# Patient Record
Sex: Male | Born: 1990 | Race: White | Hispanic: No | Marital: Married | Smoking: Current every day smoker
Health system: Southern US, Community
[De-identification: ages and names within clinical notes are randomized; demographics above are authoritative.]

## PROBLEM LIST (undated history)

## (undated) DIAGNOSIS — R011 Cardiac murmur, unspecified: Secondary | ICD-10-CM

## (undated) DIAGNOSIS — Z87442 Personal history of urinary calculi: Secondary | ICD-10-CM

---

## 2015-07-01 ENCOUNTER — Emergency Department: Payer: Self-pay

## 2015-07-01 ENCOUNTER — Emergency Department
Admission: EM | Admit: 2015-07-01 | Discharge: 2015-07-01 | Disposition: A | Discharge status: Home / Self Care | Payer: Self-pay | Attending: Emergency Medicine | Admitting: Emergency Medicine

## 2015-07-01 ENCOUNTER — Encounter: Payer: Self-pay | Admitting: *Deleted

## 2015-07-01 DIAGNOSIS — Z720 Tobacco use: Secondary | ICD-10-CM | POA: Insufficient documentation

## 2015-07-01 DIAGNOSIS — W108XXA Fall (on) (from) other stairs and steps, initial encounter: Secondary | ICD-10-CM | POA: Insufficient documentation

## 2015-07-01 DIAGNOSIS — Y9289 Other specified places as the place of occurrence of the external cause: Secondary | ICD-10-CM | POA: Insufficient documentation

## 2015-07-01 DIAGNOSIS — Y9301 Activity, walking, marching and hiking: Secondary | ICD-10-CM | POA: Insufficient documentation

## 2015-07-01 DIAGNOSIS — S66901A Unspecified injury of unspecified muscle, fascia and tendon at wrist and hand level, right hand, initial encounter: Secondary | ICD-10-CM | POA: Insufficient documentation

## 2015-07-01 DIAGNOSIS — Z880 Allergy status to penicillin: Secondary | ICD-10-CM | POA: Insufficient documentation

## 2015-07-01 DIAGNOSIS — S60211A Contusion of right wrist, initial encounter: Secondary | ICD-10-CM | POA: Insufficient documentation

## 2015-07-01 DIAGNOSIS — Y998 Other external cause status: Secondary | ICD-10-CM | POA: Insufficient documentation

## 2015-07-01 HISTORY — DX: Cardiac murmur, unspecified: R01.1

## 2015-07-01 MED ORDER — OXYCODONE-ACETAMINOPHEN 5-325 MG PO TABS
1.0000 | ORAL_TABLET | Freq: Once | ORAL | Status: AC
  Administered 2015-07-01: 1 via ORAL
  Filled 2015-07-01: qty 1

## 2015-07-01 MED ORDER — OXYCODONE-ACETAMINOPHEN 5-325 MG PO TABS: 1.0000 | ORAL_TABLET | Freq: Four times a day (QID) | ORAL | Status: DC | PRN

## 2015-07-01 MED ORDER — MELOXICAM 15 MG PO TABS: 15.0000 mg | ORAL_TABLET | Freq: Every day | ORAL | Status: DC

## 2015-07-01 NOTE — ED Provider Notes (Signed)
[facility] Emergency Department Provider Note  ____________________________________________  Time seen: Approximately 8:25 PM  I have reviewed the triage vital signs and the nursing notes.   HISTORY  Chief Complaint Hand Injury    HPI Gabriel BergeronBrandon Abrams is a 24 y.o. male who presents to the emergency department complaining of right hand pain status post an injury. He states that he was walking down a flight of steps when he missed 1 step and subsequently falling and landing on the outstretched hand. He endorses pain to the fourth and fifth digits. He endorses swelling. He endorses a minor abrasion to the PIP joint fourth finger.He states that he has been unable to move the fourth and fifth digits since injury. He denies Striking his head or losing consciousness. He denies any other injury. Pain is sharp, severe, worse with any movement.   Past Medical History  Diagnosis Date  . Heart murmur     There are no active problems to display for this patient.   History reviewed. No pertinent past surgical history.  Current Outpatient Rx  Name  Route  Sig  Dispense  Refill  . meloxicam (MOBIC) 15 MG tablet   Oral   Take 1 tablet (15 mg total) by mouth daily.   30 tablet   0   . oxyCODONE-acetaminophen (ROXICET) 5-325 MG tablet   Oral   Take 1 tablet by mouth every 6 (six) hours as needed for severe pain.   20 tablet   0     Allergies Penicillins  History reviewed. No pertinent family history.  Social History Social History  Substance Use Topics  . Smoking status: Current Every Day Smoker -- 0.50 packs/day    Types: Cigarettes  . Smokeless tobacco: None  . Alcohol Use: No    Review of Systems Constitutional: No fever/chills Eyes: No visual changes. ENT: No sore throat. Cardiovascular: Denies chest pain. Respiratory: Denies shortness of breath. Gastrointestinal: No abdominal pain.  No nausea, no vomiting.  No diarrhea.  No  constipation. Genitourinary: Negative for dysuria. Musculoskeletal: Negative for back pain. Endorses right hand pain. Skin: Negative for rash. Neurological: Negative for headaches, focal weakness or numbness.  10-point ROS otherwise negative.  ____________________________________________   PHYSICAL EXAM:  VITAL SIGNS: ED Triage Vitals  Enc Vitals Group     BP 07/01/15 1943 126/71 mmHg     Pulse Rate 07/01/15 1943 75     Resp 07/01/15 1943 18     Temp 07/01/15 1943 98.3 F (36.8 C)     Temp Source 07/01/15 1943 Oral     SpO2 07/01/15 1943 99 %     Weight 07/01/15 1943 150 lb (68.04 kg)     Height 07/01/15 1943 5\' 11"  (1.803 m)     Head Cir --      Peak Flow --      Pain Score 07/01/15 1948 10     Pain Loc --      Pain Edu? --      Excl. in GC? --     Constitutional: Alert and oriented. Well appearing and in no acute distress. Eyes: Conjunctivae are normal. PERRL. EOMI. Head: Atraumatic. Nose: No congestion/rhinnorhea. Mouth/Throat: Mucous membranes are moist.  Oropharynx non-erythematous. Neck: No stridor.  No cervical spine tenderness to palpation. Cardiovascular: Normal rate, regular rhythm. Grossly normal heart sounds.  Good peripheral circulation. Respiratory: Normal respiratory effort.  No retractions. Lungs CTAB. Gastrointestinal: Soft and nontender. No distention. No abdominal bruits. No CVA tenderness. Musculoskeletal: No lower  extremity tenderness nor edema.  No joint effusions. Edema noted to right medial hand when compared with last. No visible deformity. Minor abrasion noted to PIP joint fourth digit. No ecchymosis or contusion noted. Patient is exquisitely tender to fourth and fifth metatarsals of fourth and fifth phalanges. No specific point tenderness. No deformity palpated. Minimal range of motion to fourth and fifth digits. Sensation Refill present distally. Neurologic:  Normal speech and language. No gross focal neurologic deficits are appreciated. No gait  instability. Skin:  Skin is warm, dry and intact. No rash noted. Psychiatric: Mood and affect are normal. Speech and behavior are normal.  ____________________________________________   LABS (all labs ordered are listed, but only abnormal results are displayed)  Labs Reviewed - No data to display ____________________________________________  EKG   ____________________________________________  RADIOLOGY  Right hand x-ray Impression: No acute bony abnormality. No fractures or dislocations. ____________________________________________   PROCEDURES  Procedure(s) performed: Yes, splint, see procedure note(s).  SPLINT APPLICATION Date/Time: 8:46 PM Authorized by: Racheal Patches Consent: Verbal consent obtained. Risks and benefits: risks, benefits and alternatives were discussed Consent given by: patient Splint applied by: orthopedic technician Location details: Right hand  Splint type: Ulnar gutter  Supplies used: Ortho-Glass  Post-procedure: The splinted body part was neurovascularly unchanged following the procedure. Patient tolerance: Patient tolerated the procedure well with no immediate complications.     Critical Care performed: No  ____________________________________________   INITIAL IMPRESSION / ASSESSMENT AND PLAN / ED COURSE  Pertinent labs & imaging results that were available during my care of the patient were reviewed by me and considered in my medical decision making (see chart for details).  Patient's history, symptoms, physical exam are consistent with contusion, edema and possible tendon involvement to right hand. Advised patient and family of diagnosis and verbalize understanding. The patient will be placed in an ulnar gutter splint and sent to orthopedics on Monday for further evaluation and follow-up. The patient is to be given anti-inflammatories and narcotics for symptom control. ____________________________________________   FINAL  CLINICAL IMPRESSION(S) / ED DIAGNOSES  Final diagnoses:  Wrist contusion, right, initial encounter  Injury of tendon of hand, right, initial encounter      Racheal Patches, PA-C 07/01/15 2140  Sharyn Creamer, MD 07/02/15 [phone]

## 2015-07-01 NOTE — Discharge Instructions (Signed)
Periosteal Hematoma °Periosteal hematoma (bone bruise) is a localized, tender, raised area close to the bone. It can occur from a small hidden fracture of the bone, following surgery, or from other trauma to the area. It typically occurs in bones located close to the surface of the skin, such as the shin, knee, and heel bone. Although it may take 2 or more weeks to completely heal, bone bruises typically are not associated with permanent or serious damage to the bone. If you are taking blood thinners, you may be at greater risk for such injuries.  °CAUSES  °A bone bruise is usually caused by high-impact trauma to the bone, but it can be caused by sports injuries or twisting injuries. °SIGNS AND SYMPTOMS  °· Severe pain around the injured area that typically lasts longer than a normal bruise. °· Difficulty using the bruised area. °· Tender, raised area close to the bone. °· Discoloration or swelling of the bruised area. °DIAGNOSIS  °You may need an MRI of the injured area to confirm a bone bruise if your health care provider feels it is necessary. A regular X-ray will not detect a bone bruise, but it will detect a broken bone (fracture). An X-ray may be taken to rule out any fractures. °TREATMENT  °Often, the best treatment for a bone bruise is resting, icing, and applying cold compresses to the injured area. Over-the-counter medicines may also be recommended for pain control. °HOME CARE INSTRUCTIONS  °Some things you can do to improve the condition are:  °· Rest and elevate the area of injury as long as it is very tender or swollen. °· Apply ice to the injured area: °· Put ice in a plastic bag. °· Place a towel between your skin and the bag. °· Leave the ice on for 20 minutes, 2-3 times a day. °· Use an elastic wrap to reduce swelling and protect the injured area. Make sure it is not applied too tightly. If the area around the wrap becomes cold or blue, the wrap is too tight. Wrap it more loosely. °· For  activity: °· Follow your health care provider's instructions about whether walking with crutches is required. This will depend on how serious your condition is. °· Start weight bearing gradually on the bruised part. °· Continue to use crutches or a cane until you can stand without causing pain, or as instructed. °· If a plaster splint was applied: °· Wear the splint until you are seen for a follow-up exam. °· Rest it on nothing harder than a pillow the first 24 hours. °· Do not put weight on it. °· Do not get it wet. You may take it off to take a shower or bath. °· You may have been given an elastic bandage to use with or without the plaster splint. The splint is too tight if you have numbness or tingling, or if the skin around the bandage becomes cold and blue. Adjust the bandage to make it comfortable. °· If an air splint was applied: °· You may alter the amount of air in the splint as needed for comfort. °· You may take it off at night and to take a shower or bath. °· If the injury was in either leg, wiggle your toes in the splint several times per day if you are able. °· Only take over-the-counter or prescription medicines for pain, discomfort, or fever as directed by your health care provider. °· Keep all follow-up visits with your health care provider. This includes   any orthopedic referrals, physical therapy, and rehabilitation. Any delay in getting necessary care could result in a delay or failure of the bones to heal. SEEK MEDICAL CARE IF:   You have an increase in bruising, swelling, tenderness, heat, or pain over your injury.  You notice coldness of your toes that does not improve after removing a splint or bandage.  Your pain is not lessened after you take medicine.  You have increased difficulty bearing weight on the injured leg, if the injury is in either leg. SEEK IMMEDIATE MEDICAL CARE IF:   You have severe pain near the injured area or severe pain with stretching.  You have increased  swelling that resulted in a tense, hard area or a loss of sensation in the area of the injury.  You have pale, cool skin below the area of the injury (in an extremity) that does not go away after removing a splint or bandage. MAKE SURE YOU:   Understand these instructions.  Will watch your condition.  Will get help right away if you are not doing well or get worse.   This information is not intended to replace advice given to you by your health care provider. Make sure you discuss any questions you have with your health care provider.   Document Released: 09/20/2004 Document Revised: 06/03/2013 Document Reviewed: 01/30/2013 Elsevier Interactive Patient Education 2016 [employer] Inc[employer]  Tendon Injury Tendons are strong, cordlike structures that connect muscle to bone. Tendons are made up of woven fibers, like a rope. A tendon injury is a tear (rupture) of the tendon. The rupture may be partial (only a few of the fibers in your tendon rupture) or complete (your entire tendon ruptures). CAUSES  Tendon injuries can be caused by high-stress activities, such as sports. They also can be caused by a repetitive injury or by a single injury from an excessive, rapid force. SYMPTOMS  Symptoms of tendon injury include pain when you move the joint close to the tendon. Other symptoms are swelling, redness, and warmth. DIAGNOSIS  Tendon injuries often can be diagnosed by physical exam. However, sometimes an X-ray exam or advanced imaging, such as magnetic resonance imaging (MRI), is necessary to determine the extent of the injury. TREATMENT  Partial tendon ruptures often can be treated with immobilization. A splint, bandage, or removable brace usually is used to immobilize the injured tendon. Most injured tendons need to be immobilized for 1-2 months before they are completely healed. Complete tendon ruptures may require surgical reattachment.   This information is not intended to replace advice given to you  by your health care provider. Make sure you discuss any questions you have with your health care provider.   Document Released: 09/20/2004 Document Revised: 08/02/2011 Document Reviewed: 11/04/2011 Elsevier Interactive Patient Education 2016 Elsevier Inc.  Cryotherapy Cryotherapy means treatment with cold. Ice or gel packs can be used to reduce both pain and swelling. Ice is the most helpful within the first 24 to 48 hours after an injury or flare-up from overusing a muscle or joint. Sprains, strains, spasms, burning pain, shooting pain, and aches can all be eased with ice. Ice can also be used when recovering from surgery. Ice is effective, has very few side effects, and is safe for most people to use. PRECAUTIONS  Ice is not a safe treatment option for people with:  Raynaud phenomenon. This is a condition affecting small blood vessels in the extremities. Exposure to cold may cause your problems to return.  Cold hypersensitivity. There  are many forms of cold hypersensitivity, including:  Cold urticaria. Red, itchy hives appear on the skin when the tissues begin to warm after being iced.  Cold erythema. This is a red, itchy rash caused by exposure to cold.  Cold hemoglobinuria. Red blood cells break down when the tissues begin to warm after being iced. The hemoglobin that carry oxygen are passed into the urine because they cannot combine with blood proteins fast enough.  Numbness or altered sensitivity in the area being iced. If you have any of the following conditions, do not use ice until you have discussed cryotherapy with your caregiver:  Heart conditions, such as arrhythmia, angina, or chronic heart disease.  High blood pressure.  Healing wounds or open skin in the area being iced.  Current infections.  Rheumatoid arthritis.  Poor circulation.  Diabetes. Ice slows the blood flow in the region it is applied. This is beneficial when trying to stop inflamed tissues from  spreading irritating chemicals to surrounding tissues. However, if you expose your skin to cold temperatures for too long or without the proper protection, you can damage your skin or nerves. Watch for signs of skin damage due to cold. HOME CARE INSTRUCTIONS Follow these tips to use ice and cold packs safely.  Place a dry or damp towel between the ice and skin. A damp towel will cool the skin more quickly, so you may need to shorten the time that the ice is used.  For a more rapid response, add gentle compression to the ice.  Ice for no more than 10 to 20 minutes at a time. The bonier the area you are icing, the less time it will take to get the benefits of ice.  Check your skin after 5 minutes to make sure there are no signs of a poor response to cold or skin damage.  Rest 20 minutes or more between uses.  Once your skin is numb, you can end your treatment. You can test numbness by very lightly touching your skin. The touch should be so light that you do not see the skin dimple from the pressure of your fingertip. When using ice, most people will feel these normal sensations in this order: cold, burning, aching, and numbness.  Do not use ice on someone who cannot communicate their responses to pain, such as small children or people with dementia. HOW TO MAKE AN ICE PACK Ice packs are the most common way to use ice therapy. Other methods include ice massage, ice baths, and cryosprays. Muscle creams that cause a cold, tingly feeling do not offer the same benefits that ice offers and should not be used as a substitute unless recommended by your caregiver. To make an ice pack, do one of the following:  Place crushed ice or a bag of frozen vegetables in a sealable plastic bag. Squeeze out the excess air. Place this bag inside another plastic bag. Slide the bag into a pillowcase or place a damp towel between your skin and the bag.  Mix 3 parts water with 1 part rubbing alcohol. Freeze the mixture  in a sealable plastic bag. When you remove the mixture from the freezer, it will be slushy. Squeeze out the excess air. Place this bag inside another plastic bag. Slide the bag into a pillowcase or place a damp towel between your skin and the bag. SEEK MEDICAL CARE IF:  You develop white spots on your skin. This may give the skin a blotchy (mottled) appearance.  Your  skin turns blue or pale.  Your skin becomes waxy or hard.  Your swelling gets worse. MAKE SURE YOU:   Understand these instructions.  Will watch your condition.  Will get help right away if you are not doing well or get worse.   This information is not intended to replace advice given to you by your health care provider. Make sure you discuss any questions you have with your health care provider.   Document Released: 04/09/2011 Document Revised: 09/03/2014 Document Reviewed: 04/09/2011 Elsevier Interactive Patient Education [employer]2016 [employer] Inc.

## 2015-07-01 NOTE — ED Notes (Signed)
Right hand injury since falling down the stairs.  Denies other injuries.  Abrasion, deformity noted to right 4-5th fingers.

## 2015-07-31 ENCOUNTER — Emergency Department
Admission: EM | Admit: 2015-07-31 | Discharge: 2015-07-31 | Disposition: A | Discharge status: Home / Self Care | Payer: Self-pay | Attending: Emergency Medicine | Admitting: Emergency Medicine

## 2015-07-31 ENCOUNTER — Emergency Department: Payer: Self-pay

## 2015-07-31 ENCOUNTER — Encounter: Payer: Self-pay | Admitting: Emergency Medicine

## 2015-07-31 DIAGNOSIS — Y9389 Activity, other specified: Secondary | ICD-10-CM | POA: Insufficient documentation

## 2015-07-31 DIAGNOSIS — S62316A Displaced fracture of base of fifth metacarpal bone, right hand, initial encounter for closed fracture: Secondary | ICD-10-CM | POA: Insufficient documentation

## 2015-07-31 DIAGNOSIS — Y990 Civilian activity done for income or pay: Secondary | ICD-10-CM | POA: Insufficient documentation

## 2015-07-31 DIAGNOSIS — Z880 Allergy status to penicillin: Secondary | ICD-10-CM | POA: Insufficient documentation

## 2015-07-31 DIAGNOSIS — S62306A Unspecified fracture of fifth metacarpal bone, right hand, initial encounter for closed fracture: Secondary | ICD-10-CM

## 2015-07-31 DIAGNOSIS — Y9289 Other specified places as the place of occurrence of the external cause: Secondary | ICD-10-CM | POA: Insufficient documentation

## 2015-07-31 DIAGNOSIS — W1789XA Other fall from one level to another, initial encounter: Secondary | ICD-10-CM | POA: Insufficient documentation

## 2015-07-31 DIAGNOSIS — F17210 Nicotine dependence, cigarettes, uncomplicated: Secondary | ICD-10-CM | POA: Insufficient documentation

## 2015-07-31 DIAGNOSIS — Z791 Long term (current) use of non-steroidal anti-inflammatories (NSAID): Secondary | ICD-10-CM | POA: Insufficient documentation

## 2015-07-31 MED ORDER — OXYCODONE-ACETAMINOPHEN 5-325 MG PO TABS
2.0000 | ORAL_TABLET | Freq: Once | ORAL | Status: AC
  Administered 2015-07-31: 2 via ORAL
  Filled 2015-07-31: qty 2

## 2015-07-31 MED ORDER — LIDOCAINE HCL (PF) 1 % IJ SOLN
INTRAMUSCULAR | Status: AC
  Filled 2015-07-31: qty 5

## 2015-07-31 MED ORDER — OXYCODONE-ACETAMINOPHEN 5-325 MG PO TABS: 1.0000 | ORAL_TABLET | Freq: Four times a day (QID) | ORAL | Status: DC | PRN

## 2015-07-31 MED ORDER — IBUPROFEN 800 MG PO TABS
800.0000 mg | ORAL_TABLET | Freq: Three times a day (TID) | ORAL | Status: DC | PRN
Start: 2015-07-31 — End: 2015-09-07

## 2015-07-31 NOTE — Discharge Instructions (Signed)
Wear splint until evaluation by Ortho clinic.

## 2015-07-31 NOTE — ED Provider Notes (Signed)
[facility] Emergency Department Provider Note  ____________________________________________  Time seen: Approximately 10:17 PM  I have reviewed the triage vital signs and the nursing notes.   HISTORY  Chief Complaint Hand Injury    HPI Gabriel Mckee is a 24 y.o. male chief complaint right hand pain secondary to blunt trauma. Patient state he was working on a transmission when it fell approximately 2 feet landing on his right hand. Patient stated was bleeding between the webspace which is resolved. Patient stated decreased range of motion with movement and fifth digit. Patient rates pain as a 10 over 10. Patient describes pain as sharp. No palliative measures taken for this complaint. Patient is right-hand dominant.   Past Medical History  Diagnosis Date  . Heart murmur     There are no active problems to display for this patient.   History reviewed. No pertinent past surgical history.  Current Outpatient Rx  Name  Route  Sig  Dispense  Refill  . ibuprofen (ADVIL,MOTRIN) 800 MG tablet   Oral   Take 1 tablet (800 mg total) by mouth every 8 (eight) hours as needed for moderate pain.   15 tablet   0   . meloxicam (MOBIC) 15 MG tablet   Oral   Take 1 tablet (15 mg total) by mouth daily.   30 tablet   0   . oxyCODONE-acetaminophen (ROXICET) 5-325 MG tablet   Oral   Take 1 tablet by mouth every 6 (six) hours as needed for severe pain.   20 tablet   0   . oxyCODONE-acetaminophen (ROXICET) 5-325 MG tablet   Oral   Take 1 tablet by mouth every 6 (six) hours as needed for severe pain.   12 tablet   0     Allergies Penicillins  History reviewed. No pertinent family history.  Social History Social History  Substance Use Topics  . Smoking status: Current Every Day Smoker -- 0.50 packs/day    Types: Cigarettes  . Smokeless tobacco: None  . Alcohol Use: No    Review of Systems Constitutional: No fever/chills Eyes: No visual  changes. ENT: No sore throat. Cardiovascular: Denies chest pain. Respiratory: Denies shortness of breath. Gastrointestinal: No abdominal pain.  No nausea, no vomiting.  No diarrhea.  No constipation. Genitourinary: Negative for dysuria. Musculoskeletal: Negative for back pain. Skin: Negative for rash. Neurological: Negative for headaches, focal weakness or numbness. Allergic/Immunilogical: Penicillin  10-point ROS otherwise negative.  ____________________________________________   PHYSICAL EXAM:  VITAL SIGNS: ED Triage Vitals  Enc Vitals Group     BP 07/31/15 2206 116/73 mmHg     Pulse Rate 07/31/15 2206 98     Resp 07/31/15 2206 20     Temp 07/31/15 2206 98.1 F (36.7 C)     Temp Source 07/31/15 2206 Oral     SpO2 07/31/15 2206 97 %     Weight 07/31/15 2206 161 lb (73.029 kg)     Height 07/31/15 2206  (1.803 m)     Head Cir --      Peak Flow --      Pain Score 07/31/15 2212 10     Pain Loc --      Pain Edu? --      Excl. in GC? --     Constitutional: Alert and oriented. Well appearing and in no acute distress. Eyes: Conjunctivae are normal. PERRL. EOMI. Head: Atraumatic. Nose: No congestion/rhinnorhea. Mouth/Throat: Mucous membranes are moist.  Oropharynx non-erythematous. Neck: No stridor.  No cervical spine tenderness to palpation. Hematological/Lymphatic/Immunilogical: No cervical lymphadenopathy. Cardiovascular: Normal rate, regular rhythm. Grossly normal heart sounds.  Good peripheral circulation. Respiratory: Normal respiratory effort.  No retractions. Lungs CTAB. Gastrointestinal: Soft and nontender. No distention. No abdominal bruits. No CVA tenderness. Musculoskeletal: No lower extremity tenderness nor edema.  No joint effusions. Neurologic:  Normal speech and language. No gross focal neurologic deficits are appreciated. No gait instability. Skin:  Skin is warm, dry and intact. No rash noted. Psychiatric: Mood and affect are normal. Speech and  behavior are normal.  ____________________________________________   LABS (all labs ordered are listed, but only abnormal results are displayed)  Labs Reviewed - No data to display ____________________________________________  EKG   ____________________________________________  RADIOLOGY  Mildly displaced slightly comminuted fracture to the base of the fifth metacarpal right hand. I, Joni Reiningonald K Sinclaire Artiga, personally viewed and evaluated these images (plain radiographs) as part of my medical decision making.   ____________________________________________   PROCEDURES  Procedure(s) performed: None  Critical Care performed: No  ____________________________________________   INITIAL IMPRESSION / ASSESSMENT AND PLAN / ED COURSE  Pertinent labs & imaging results that were available during my care of the patient were reviewed by me and considered in my medical decision making (see chart for details). Right fifth metacarpal fracture. Patient placed in ulnar gutter splint. Patient given a prescription for Percocets and ibuprofen. Patient advised to call orthopedic clinic in the morning for follow-up evaluation and treatment. ____________________________________________   FINAL CLINICAL IMPRESSION(S) / ED DIAGNOSES  Final diagnoses:  Fracture of fifth metacarpal bone of right hand, closed, initial encounter      Joni ReiningRonald K Nikolaos Maddocks, PA-C 07/31/15 2313  Jeanmarie PlantJames A McShane, MD 07/31/15 2355

## 2015-07-31 NOTE — ED Notes (Signed)
Was working on a transmission and it fell onto his hand from a 2 foot height. Small amt of blood between the 4th and 5th digit. No other laceration.

## 2015-09-07 ENCOUNTER — Emergency Department
Admission: EM | Admit: 2015-09-07 | Discharge: 2015-09-07 | Disposition: A | Discharge status: Home / Self Care | Payer: Self-pay | Attending: Emergency Medicine | Admitting: Emergency Medicine

## 2015-09-07 DIAGNOSIS — Z880 Allergy status to penicillin: Secondary | ICD-10-CM | POA: Insufficient documentation

## 2015-09-07 DIAGNOSIS — X58XXXD Exposure to other specified factors, subsequent encounter: Secondary | ICD-10-CM | POA: Insufficient documentation

## 2015-09-07 DIAGNOSIS — F17210 Nicotine dependence, cigarettes, uncomplicated: Secondary | ICD-10-CM | POA: Insufficient documentation

## 2015-09-07 DIAGNOSIS — Z791 Long term (current) use of non-steroidal anti-inflammatories (NSAID): Secondary | ICD-10-CM | POA: Insufficient documentation

## 2015-09-07 DIAGNOSIS — S62306D Unspecified fracture of fifth metacarpal bone, right hand, subsequent encounter for fracture with routine healing: Secondary | ICD-10-CM | POA: Insufficient documentation

## 2015-09-07 MED ORDER — IBUPROFEN 800 MG PO TABS: 800.0000 mg | ORAL_TABLET | Freq: Three times a day (TID) | ORAL | Status: DC

## 2015-09-07 NOTE — ED Provider Notes (Signed)
[facility] Emergency Department Provider Note  ____________________________________________  Time seen: Approximately 11:32 AM  I have reviewed the triage vital signs and the nursing notes.   HISTORY  Chief Complaint Hand Pain   HPI Gabriel Mckee is a 25 y.o. male here complaint of right hand pain. He states that he was seen by the orthopedic Department and advised to come back to the emergency room if he continued to have any pain. He denies any re-injury of his right hand.He was seen in the emergency room on 12/4 for a fracture of the proximal aspect of his fifth metacarpal. He states that he did [facility] with Marcelline Matesodd Munday PA-C but has not seen him since December/23. Currently he rates his pain as a 7 out of 10.   Past Medical History  Diagnosis Date  . Heart murmur     There are no active problems to display for this patient.   No past surgical history on file.  Current Outpatient Rx  Name  Route  Sig  Dispense  Refill  . ibuprofen (ADVIL,MOTRIN) 800 MG tablet   Oral   Take 1 tablet (800 mg total) by mouth 3 (three) times daily.   30 tablet   0   . meloxicam (MOBIC) 15 MG tablet   Oral   Take 1 tablet (15 mg total) by mouth daily.   30 tablet   0     Allergies Penicillins  No family history on file.  Social History Social History  Substance Use Topics  . Smoking status: Current Every Day Smoker -- 0.50 packs/day    Types: Cigarettes  . Smokeless tobacco: Not on file  . Alcohol Use: No    Review of Systems Constitutional: No fever/chills Cardiovascular: Denies chest pain. Respiratory: Denies shortness of breath. Gastrointestinal:  No nausea, no vomiting.   Musculoskeletal: Negative for back pain. Positive right hand pain Skin: Negative for rash. Neurological: Negative for headaches, focal weakness or numbness.  10-point ROS otherwise negative.  ____________________________________________   PHYSICAL  EXAM:  VITAL SIGNS: ED Triage Vitals  Enc Vitals Group     BP 09/07/15 1117 123/80 mmHg     Pulse Rate 09/07/15 1117 83     Resp 09/07/15 1117 18     Temp 09/07/15 1117 98.9 F (37.2 C)     Temp Source 09/07/15 1117 Oral     SpO2 09/07/15 1117 100 %     Weight 09/07/15 1117 150 lb (68.04 kg)     Height 09/07/15 1117 5\' 11"  (1.803 m)     Head Cir --      Peak Flow --      Pain Score 09/07/15 1117 7     Pain Loc --      Pain Edu? --      Excl. in GC? --     Constitutional: Alert and oriented. Well appearing and in no acute distress. Eyes: Conjunctivae are normal. PERRL. EOMI. Head: Atraumatic. Nose: No congestion/rhinnorhea. Neck: No stridor.   Cardiovascular: Normal rate, regular rhythm. Grossly normal heart sounds.  Good peripheral circulation. Respiratory: Normal respiratory effort.  No retractions. Lungs CTAB. Gastrointestinal: Soft and nontender. No distention.  Musculoskeletal: Right hand with edema and marked tenderness noted on the fifth metacarpal to palpation. Range of motion of the fifth digit is slightly decreased secondary to pain in the metacarpal area. Motor sensory function intact. Neurologic:  Normal speech and language. No gross focal neurologic deficits are appreciated. No gait instability.  Skin:  Skin is warm, dry and intact. No rash noted. No ecchymosis, erythema or abrasions were noted. Psychiatric: Mood and affect are normal. Speech and behavior are normal.  ____________________________________________   LABS (all labs ordered are listed, but only abnormal results are displayed)  Labs Reviewed - No data to display   PROCEDURES  Procedure(s) performed: None  Critical Care performed: No  ____________________________________________   INITIAL IMPRESSION / ASSESSMENT AND PLAN / ED COURSE  Pertinent labs & imaging results that were available during my care of the patient were reviewed by me and considered in my medical decision making (see chart  for details).  Patient currently was not wearing a brace or splint to his hand. A call was made to [facility] orthopedic Department and notes from his last visit on 12/23 indicating that he was to follow-up with orthopedic Department in 3 weeks. He was to also continue wearing his brace to protect his hand. Patient states he was not aware that he was supposed to make an appointment but will call today. ____________________________________________   FINAL CLINICAL IMPRESSION(S) / ED DIAGNOSES  Final diagnoses:  Fracture of fifth metacarpal bone of right hand, with routine healing, subsequent encounter      Tommi Rumps, PA-C 09/07/15 1459  Minna Antis, MD 09/07/15 1501

## 2015-09-07 NOTE — ED Notes (Signed)
Pt reports broke his right wrist recently and is still having some pain and swelling. Pt reports he spoke with his Ortho MD, Lenard ForthMundy and was advised to come back to the ED if he continued to have pain. Pt denies re-injury.

## 2015-09-07 NOTE — Discharge Instructions (Signed)
Call today for an appointment with Gabriel Mckee Wear splint until you are seen. Take medications as directed.

## 2015-12-07 ENCOUNTER — Emergency Department: Payer: Self-pay

## 2015-12-07 ENCOUNTER — Emergency Department
Admission: EM | Admit: 2015-12-07 | Discharge: 2015-12-07 | Disposition: A | Discharge status: Home / Self Care | Payer: Self-pay | Attending: Emergency Medicine | Admitting: Emergency Medicine

## 2015-12-07 ENCOUNTER — Encounter: Payer: Self-pay | Admitting: Emergency Medicine

## 2015-12-07 DIAGNOSIS — F17210 Nicotine dependence, cigarettes, uncomplicated: Secondary | ICD-10-CM | POA: Insufficient documentation

## 2015-12-07 DIAGNOSIS — R1032 Left lower quadrant pain: Secondary | ICD-10-CM | POA: Insufficient documentation

## 2015-12-07 DIAGNOSIS — R109 Unspecified abdominal pain: Secondary | ICD-10-CM

## 2015-12-07 DIAGNOSIS — K529 Noninfective gastroenteritis and colitis, unspecified: Secondary | ICD-10-CM

## 2015-12-07 HISTORY — DX: Personal history of urinary calculi: Z87.442

## 2015-12-07 LAB — COMPREHENSIVE METABOLIC PANEL
ALT: 13 U/L — ABNORMAL LOW (ref 17–63)
AST: 23 U/L (ref 15–41)
Albumin: 4.7 g/dL (ref 3.5–5.0)
Alkaline Phosphatase: 62 U/L (ref 38–126)
Anion gap: 9 (ref 5–15)
BILIRUBIN TOTAL: 0.6 mg/dL (ref 0.3–1.2)
BUN: 14 mg/dL (ref 6–20)
CHLORIDE: 104 mmol/L (ref 101–111)
CO2: 25 mmol/L (ref 22–32)
CREATININE: 0.83 mg/dL (ref 0.61–1.24)
Calcium: 9.2 mg/dL (ref 8.9–10.3)
Glucose, Bld: 93 mg/dL (ref 65–99)
POTASSIUM: 3.8 mmol/L (ref 3.5–5.1)
Sodium: 138 mmol/L (ref 135–145)
TOTAL PROTEIN: 7.5 g/dL (ref 6.5–8.1)

## 2015-12-07 LAB — CBC
HEMATOCRIT: 41.6 % (ref 40.0–52.0)
Hemoglobin: 14 g/dL (ref 13.0–18.0)
MCH: 30.6 pg (ref 26.0–34.0)
MCHC: 33.7 g/dL (ref 32.0–36.0)
MCV: 90.8 fL (ref 80.0–100.0)
PLATELETS: 255 10*3/uL (ref 150–440)
RBC: 4.59 MIL/uL (ref 4.40–5.90)
RDW: 14.2 % (ref 11.5–14.5)
WBC: 9.2 10*3/uL (ref 3.8–10.6)

## 2015-12-07 LAB — URINALYSIS COMPLETE WITH MICROSCOPIC (ARMC ONLY)
BILIRUBIN URINE: NEGATIVE
GLUCOSE, UA: NEGATIVE mg/dL
HGB URINE DIPSTICK: NEGATIVE
KETONES UR: NEGATIVE mg/dL
LEUKOCYTES UA: NEGATIVE
NITRITE: NEGATIVE
PH: 7 (ref 5.0–8.0)
Protein, ur: NEGATIVE mg/dL
RBC / HPF: NONE SEEN RBC/hpf (ref 0–5)
SPECIFIC GRAVITY, URINE: 1.012 (ref 1.005–1.030)
Squamous Epithelial / LPF: NONE SEEN

## 2015-12-07 LAB — LIPASE, BLOOD: LIPASE: 30 U/L (ref 11–51)

## 2015-12-07 MED ORDER — ONDANSETRON HCL 4 MG PO TABS: 4.0000 mg | ORAL_TABLET | Freq: Every day | ORAL | Status: DC | PRN

## 2015-12-07 MED ORDER — MORPHINE SULFATE (PF) 4 MG/ML IV SOLN
4.0000 mg | Freq: Once | INTRAVENOUS | Status: AC
  Administered 2015-12-07: 4 mg via INTRAVENOUS
  Filled 2015-12-07: qty 1

## 2015-12-07 MED ORDER — KETOROLAC TROMETHAMINE 30 MG/ML IJ SOLN
15.0000 mg | Freq: Once | INTRAMUSCULAR | Status: AC
  Administered 2015-12-07: 15 mg via INTRAVENOUS
  Filled 2015-12-07: qty 1

## 2015-12-07 MED ORDER — CLONAZEPAM 0.5 MG PO TABS
ORAL_TABLET | ORAL | Status: AC
  Filled 2015-12-07: qty 1

## 2015-12-07 MED ORDER — SODIUM CHLORIDE 0.9 % IV BOLUS (SEPSIS)
1000.0000 mL | Freq: Once | INTRAVENOUS | Status: AC
  Administered 2015-12-07: 1000 mL via INTRAVENOUS

## 2015-12-07 MED ORDER — ONDANSETRON HCL 4 MG/2ML IJ SOLN
4.0000 mg | Freq: Once | INTRAMUSCULAR | Status: AC
  Administered 2015-12-07: 4 mg via INTRAVENOUS
  Filled 2015-12-07: qty 2

## 2015-12-07 NOTE — ED Provider Notes (Signed)
[facility] Emergency Department Provider Note  ____________________________________________   I have reviewed the triage vital signs and the nursing notes.   HISTORY  Chief Complaint Nausea and Back Pain    HPI Gabriel BergeronBrandon Mckee is a 25 y.o. male presents today complaining of nausea vomiting diarrhea abdominal pain and back pain. He has a history of kidney stones. He feels he may be passing kidney stone. However, he has had 3 episodes of nonbloody non-melanotic diarrhea. He feels it is watery. He denies any fever or chills. Denies any testicular pain or swelling. He has pain that goes from his left lower abdomen around towards his back. It is cramping. All of this started this morning. He had nonbloody nonbilious emesis 1 as well.     Past Medical History  Diagnosis Date  . Heart murmur   . History of kidney stones     There are no active problems to display for this patient.   History reviewed. No pertinent past surgical history.  Current Outpatient Rx  Name  Route  Sig  Dispense  Refill  . ibuprofen (ADVIL,MOTRIN) 800 MG tablet   Oral   Take 1 tablet (800 mg total) by mouth 3 (three) times daily.   30 tablet   0   . meloxicam (MOBIC) 15 MG tablet   Oral   Take 1 tablet (15 mg total) by mouth daily.   30 tablet   0     Allergies Amoxicillin and Penicillins  No family history on file.  Social History Social History  Substance Use Topics  . Smoking status: Current Every Day Smoker -- 0.50 packs/day    Types: Cigarettes  . Smokeless tobacco: None  . Alcohol Use: No    Review of Systems Constitutional: No fever/chills Eyes: No visual changes. ENT: No sore throat. No stiff neck no neck pain Cardiovascular: Denies chest pain. Respiratory: Denies shortness of breath. Gastrointestinal:  See history of present illness Genitourinary: Negative for dysuria. Musculoskeletal: Negative lower extremity swelling Skin: Negative for  rash. Neurological: Negative for headaches, focal weakness or numbness. 10-point ROS otherwise negative.  ____________________________________________   PHYSICAL EXAM:  VITAL SIGNS: ED Triage Vitals  Enc Vitals Group     BP 12/07/15 1155 120/78 mmHg     Pulse Rate 12/07/15 1155 77     Resp 12/07/15 1155 16     Temp 12/07/15 1155 98 F (36.7 C)     Temp Source 12/07/15 1155 Oral     SpO2 12/07/15 1155 100 %     Weight 12/07/15 1155 150 lb (68.04 kg)     Height 12/07/15 1155 5\' 11"  (1.803 m)     Head Cir --      Peak Flow --      Pain Score 12/07/15 1152 9     Pain Loc --      Pain Edu? --      Excl. in GC? --     Constitutional: Alert and oriented. Well appearing and in no acute distress. Eyes: Conjunctivae are normal. PERRL. EOMI. Head: Atraumatic. Nose: No congestion/rhinnorhea. Mouth/Throat: Mucous membranes are moist.  Oropharynx non-erythematous. Neck: No stridor.   Nontender with no meningismus Cardiovascular: Normal rate, regular rhythm. Grossly normal heart sounds.  Good peripheral circulation. Respiratory: Normal respiratory effort.  No retractions. Lungs CTAB. Abdominal: Soft and Mild nonsurgical left lower quadrant discomfort. No distention. No guarding no rebound Back:  There is no focal tenderness or step off there is no midline tenderness there are  no lesions noted. there is Mild left CVA tenderness {GU: Normal external male genitalia no masses lesions or discomfort Musculoskeletal: No lower extremity tenderness. No joint effusions, no DVT signs strong distal pulses no edema Neurologic:  Normal speech and language. No gross focal neurologic deficits are appreciated.  Skin:  Skin is warm, dry and intact. No rash noted. Psychiatric: Mood and affect are normal. Speech and behavior are normal.  ____________________________________________   LABS (all labs ordered are listed, but only abnormal results are displayed)  Labs Reviewed  COMPREHENSIVE METABOLIC  PANEL - Abnormal; Notable for the following:    ALT 13 (*)    All other components within normal limits  URINALYSIS COMPLETEWITH MICROSCOPIC ([facility] ONLY) - Abnormal; Notable for the following:    Color, Urine YELLOW (*)    APPearance CLEAR (*)    Bacteria, UA RARE (*)    All other components within normal limits  LIPASE, BLOOD  CBC   ____________________________________________  EKG  I personally interpreted any EKGs ordered by me or triage  ____________________________________________  RADIOLOGY  I reviewed any imaging ordered by me or triage that were performed during my shift and, if possible, patient and/or family made aware of any abnormal findings. ____________________________________________   PROCEDURES  Procedure(s) performed: None  Critical Care performed: None  ____________________________________________   INITIAL IMPRESSION / ASSESSMENT AND PLAN / ED COURSE  Pertinent labs & imaging results that were available during my care of the patient were reviewed by me and considered in my medical decision making (see chart for details).  Patient with nausea vomiting diarrhea history of kidney stones and did have some flank pain, I did do a CT scan to rule out concomitant stone that is negative. Blood work is reassuring vital signs are reassuring, patient is feeling somewhat better although he still has cramping discomfort. Likely this is a viral syndrome. No evidence of testicular pain or swelling. We will see if we can get him feeling better and try to get him home for continued management at this gastroenteritis with normal CT scan blood work and vital signs ____________________________________________   FINAL CLINICAL IMPRESSION(S) / ED DIAGNOSES  Final diagnoses:  Flank pain      This chart was dictated using voice recognition software.  Despite best efforts to proofread,  errors can occur which can change meaning.     Jeanmarie Plant, MD 12/07/15 1353

## 2015-12-07 NOTE — Discharge Instructions (Signed)

## 2015-12-07 NOTE — ED Notes (Signed)
Patient comes into the ED via POV c/o N/V/D and lower back pain.  Patient states it began this morning and he had no symptoms yesterday.  H/o of kidney stones.  Patient in NAD at this time.  Respirations even and unlabored.

## 2015-12-07 NOTE — ED Notes (Signed)
Patient transported to CT 

## 2015-12-11 ENCOUNTER — Emergency Department: Payer: Self-pay

## 2015-12-11 ENCOUNTER — Emergency Department
Admission: EM | Admit: 2015-12-11 | Discharge: 2015-12-11 | Disposition: A | Discharge status: Home / Self Care | Payer: Self-pay | Attending: Emergency Medicine | Admitting: Emergency Medicine

## 2015-12-11 DIAGNOSIS — Y999 Unspecified external cause status: Secondary | ICD-10-CM | POA: Insufficient documentation

## 2015-12-11 DIAGNOSIS — W109XXA Fall (on) (from) unspecified stairs and steps, initial encounter: Secondary | ICD-10-CM | POA: Insufficient documentation

## 2015-12-11 DIAGNOSIS — F17210 Nicotine dependence, cigarettes, uncomplicated: Secondary | ICD-10-CM | POA: Insufficient documentation

## 2015-12-11 DIAGNOSIS — S8002XA Contusion of left knee, initial encounter: Secondary | ICD-10-CM | POA: Insufficient documentation

## 2015-12-11 DIAGNOSIS — S80212A Abrasion, left knee, initial encounter: Secondary | ICD-10-CM

## 2015-12-11 DIAGNOSIS — M7652 Patellar tendinitis, left knee: Secondary | ICD-10-CM | POA: Insufficient documentation

## 2015-12-11 DIAGNOSIS — Y929 Unspecified place or not applicable: Secondary | ICD-10-CM | POA: Insufficient documentation

## 2015-12-11 DIAGNOSIS — M9252 Juvenile osteochondrosis of tibia and fibula, left leg: Secondary | ICD-10-CM | POA: Insufficient documentation

## 2015-12-11 DIAGNOSIS — Y9301 Activity, walking, marching and hiking: Secondary | ICD-10-CM | POA: Insufficient documentation

## 2015-12-11 DIAGNOSIS — M92522 Juvenile osteochondrosis of tibia tubercle, left leg: Secondary | ICD-10-CM

## 2015-12-11 DIAGNOSIS — Z791 Long term (current) use of non-steroidal anti-inflammatories (NSAID): Secondary | ICD-10-CM | POA: Insufficient documentation

## 2015-12-11 MED ORDER — NAPROXEN 500 MG PO TBEC: 500.0000 mg | DELAYED_RELEASE_TABLET | Freq: Two times a day (BID) | ORAL | Status: DC

## 2015-12-11 MED ORDER — TRAMADOL HCL 50 MG PO TABS
50.0000 mg | ORAL_TABLET | Freq: Once | ORAL | Status: AC
  Administered 2015-12-11: 50 mg via ORAL
  Filled 2015-12-11: qty 1

## 2015-12-11 NOTE — ED Notes (Signed)
Pt stepped off the stairs last night and twisted his left knee. Continued pain to left knee.

## 2015-12-11 NOTE — Discharge Instructions (Signed)
Patellar Tendinitis With Rehab Tendinitis is inflammation of a tendon. Tendonitis of the tendon below the kneecap (patella) is known as patellar tendonitis. Patellar tendonitis is also called jumper's knee. Jumper's knee is a common cause of pain below the kneecap (infrapatellar). Jumper's knee may involve a tear (strain) in the ligament. Strains are classified into three categories. Grade 1 strains cause pain, but the tendon is not lengthened. Grade 2 strains include a lengthened ligament, due to the ligament being stretched or partially ruptured. With grade 2 strains there is still function, although function may be decreased. Grade 3 strains involve a complete tear of the tendon or muscle, and function is usually impaired. Patellar tendon strains are usually grade 1 or 2.  SYMPTOMS   Pain, tenderness, swelling, warmth, or redness over the patellar tendon (just below the kneecap).  Pain and loss of strength (sometimes), with forcefully straightening the knee (especially when jumping or rising from a seated or squatting position), or bending the knee completely (squatting or kneeling).  Crackling sound (crepitation) when the tendon is moved or touched. CAUSES  Patellar tendonitis is caused by injury to the patellar tendon. The inflammation is the body's healing response. Common causes of injury include:  Stress from a sudden increase in intensity, frequency, or duration of training.  Overuse of the thigh muscles (quadriceps) and patellar tendon.  Direct hit (trauma) to the knee or patellar tendon. RISK INCREASES WITH:  Sports that require sudden, explosive quadriceps contraction, such as jumping, quick starts, or kicking.  Running sports, especially running down hills.  Poor strength and flexibility of the thigh and knee.  Flat feet. PREVENTION  Warm up and stretch properly before activity.  Allow for adequate recovery between workouts.  Maintain physical fitness:  Strength,  flexibility, and endurance.  Cardiovascular fitness.  Protect the knee joint with taping, protective strapping, bracing, or elastic compression bandage.  Wear arch supports (orthotics). PROGNOSIS  If treated properly, patellar tendonitis usually heals within 6 weeks.  RELATED COMPLICATIONS   Longer healing time if not properly treated or if not given enough time to heal.  Recurring symptoms if activity is resumed too soon, with overuse, with a direct blow, or when using poor technique.  If untreated, tendon rupture requiring surgery. TREATMENT Treatment first involves the use of ice and medicine to reduce pain and inflammation. The use of strengthening and stretching exercises may help reduce pain with activity. These exercises may be performed at home or with a therapist. Serious cases of tendonitis may require restraining the knee for 10 to 14 days to prevent stress on the tendon and to promote healing. Crutches may be used (uncommon) until you can walk without a limp. For cases in which nonsurgical treatment is unsuccessful, surgery may be advised to remove the inflamed tendon lining (sheath). Surgery is rare, and is only advised after at least 6 months of nonsurgical treatment. MEDICATION   If pain medicine is needed, nonsteroidal anti-inflammatory medicines (aspirin and ibuprofen), or other minor pain relievers (acetaminophen), are often advised.  Do not take pain medicine for 7 days before surgery.  Prescription pain relievers may be given if your caregiver thinks they are needed. Use only as directed and only as much as you need. HEAT AND COLD  Cold treatment (icing) should be applied for 10 to 15 minutes every 2 to 3 hours for inflammation and pain, and immediately after activity that aggravates your symptoms. Use ice packs or an ice massage.  Heat treatment may be used before  performing stretching and strengthening activities prescribed by your caregiver, physical therapist, or  athletic trainer. Use a heat pack or a warm water soak. SEEK MEDICAL CARE IF:  Symptoms get worse or do not improve in 2 weeks, despite treatment.  New, unexplained symptoms develop. (Drugs used in treatment may produce side effects.) EXERCISES RANGE OF MOTION (ROM) AND STRETCHING EXERCISES - Patellar Tendinitis (Jumper's Knee) These are some of the initial exercises with which you may start your rehabilitation program, until you see your caregiver again or until your symptoms are resolved. Remember:   Flexible tissue is more tolerant of the stresses placed on it during activities.  Each stretch should be held for 20 to 30 seconds.  A gentle stretching sensation should be felt. STRETCH - Hamstrings, Supine  Lie on your back. Loop a belt or towel over the ball of your right / left foot.  Straighten your right / left knee and slowly pull on the belt to raise your leg. Do not allow the right / left knee to bend. Keep your opposite leg flat on the floor.  Raise the leg until you feel a gentle stretch behind your right / left knee or thigh. Hold this position for __________ seconds. Repeat __________ times. Complete this stretch __________ times per day.  STRETCH - Hamstrings, Doorway  Lie on your back with your right / left leg extended and resting on the wall, and the opposite leg flat on the ground through the door. At first, position your bottom farther away from the wall.  Keep your right / left knee straight. If you feel a stretch behind your knee or thigh, hold this position for __________ seconds.  If you do not feel a stretch, scoot your bottom closer to the door, and hold __________ seconds. Repeat __________ times. Complete this stretch __________ times per day.  STRETCH - Hamstrings, Standing  Stand or sit and extend your right / left leg, placing your foot on a chair or foot stool.  Keep a slight arch in your low back and your hips straight forward.  Lead with your chest  and lean forward at the waist until you feel a gentle stretch in the back of your right / left knee or thigh. (When done correctly, this exercise requires leaning only a small distance.)  Hold this position for __________ seconds. Repeat __________ times. Complete this stretch __________ times per day. STRETCH - Adductors, Lunge  While standing, spread your legs, with your right / left leg behind you.  Lean away from your right / left leg by bending your opposite knee. You may rest your hands on your thigh for balance.  You should feel a stretch in your right / left inner thigh. Hold for __________ seconds. Repeat __________ times. Complete this exercise __________ times per day.  STRENGTHENING EXERCISES - Patellar Tendinitis (Jumper's Knee) These exercises may help you when beginning to rehabilitate your injury. They may resolve your symptoms with or without further involvement from your physician, physical therapist or athletic trainer. While completing these exercises, remember:   Muscles can gain both the endurance and the strength needed for everyday activities through controlled exercises.  Complete these exercises as instructed by your physician, physical therapist or athletic trainer. Increase the resistance and repetitions only as guided by your caregiver. STRENGTH - Quadriceps, Isometrics  Lie on your back with your right / left leg extended and your opposite knee bent.  Gradually tense the muscles in the front of your right /  left thigh. You should see either your kneecap slide up toward your hip or increased dimpling just above the knee. This motion will push the back of the knee down toward the floor, mat, or bed on which you are lying.  Hold the muscle as tight as you can, without increasing your pain, for __________ seconds.  Relax the muscles slowly and completely in between each repetition. Repeat __________ times. Complete this exercise __________ times per day.    STRENGTH - Quadriceps, Short Arcs  Lie on your back. Place a __________ inch towel roll under your right / left knee, so that the knee bends slightly.  Raise only your lower leg by tightening the muscles in the front of your thigh. Do not allow your thigh to rise.  Hold this position for __________ seconds. Repeat __________ times. Complete this exercise __________ times per day.  OPTIONAL ANKLE WEIGHTS: Begin with ____________________, but DO NOT exceed ____________________. Increase in 1 pound/ 0.5 kilogram increments. STRENGTH - Quadriceps, Straight Leg Raises  Quality counts! Watch for signs that the quadriceps muscle is working, to be sure you are strengthening the correct muscles and not "cheating" by substituting with healthier muscles.  Lay on your back with your right / left leg extended and your opposite knee bent.  Tense the muscles in the front of your right / left thigh. You should see either your kneecap slide up or increased dimpling just above the knee. Your thigh may even shake a bit.  Tighten these muscles even more and raise your leg 4 to 6 inches off the floor. Hold for __________ seconds.  Keeping these muscles tense, lower your leg.  Relax the muscles slowly and completely between each repetition. Repeat __________ times. Complete this exercise __________ times per day.  STRENGTH - Quadriceps, Squats  Stand in a door frame so that your feet and knees are in line with the frame.  Use your hands for balance, not support, on the frame.  Slowly lower your weight, bending at the hips and knees. Keep your lower legs upright so that they are parallel with the door frame. Squat only within the range that does not increase your knee pain. Never let your hips drop below your knees.  Slowly return upright, pushing with your legs, not pulling with your hands. Repeat __________ times. Complete this exercise __________ times per day.  STRENGTH - Quadriceps,  Step-Downs  Stand on the edge of a step stool or stair. Be prepared to use a countertop or wall for balance, if needed.  Keeping your right / left knee directly over the middle of your foot, slowly touch your opposite heel to the floor or lower step. Do not go all the way to the floor if your knee pain increases; just go as far as you can without increased discomfort. Use your right / left leg muscles, not gravity to lower your body weight.  Slowly push your body weight back up to the starting position. Repeat __________ times. Complete this exercise __________ times per day.    This information is not intended to replace advice given to you by your health care provider. Make sure you discuss any questions you have with your health care provider.   Document Released: 08/13/2005 Document Revised: 12/28/2014 Document Reviewed: 11/25/2008 Elsevier Interactive Patient Education 2016 [employer]  Abrasion An abrasion is a cut or scrape on the surface of your skin. An abrasion does not go through all of the layers of your skin. It is  important to take good care of your abrasion to prevent infection. HOME CARE Medicines  Take or apply medicines only as told by your doctor.  If you were prescribed an antibiotic ointment, finish all of it even if you start to feel better. Wound Care  Clean the wound with mild soap and water 2-3 times per day or as told by your doctor. Pat your wound dry with a clean towel. Do not rub it.  There are many ways to close and cover a wound. Follow instructions from your doctor about:  How to take care of your wound.  When and how you should change your bandage (dressing).  When and how you should take off your dressing.  Check your wound every day for signs of infection. Watch for:  Redness, swelling, or pain.  Fluid, blood, or pus. General Instructions  Keep the dressing dry as told by your doctor. Do not take baths, swim, use a hot tub, or do anything  that would put your wound underwater until your doctor says it is okay.  If there is swelling, raise (elevate) the injured area above the level of your heart while you are sitting or lying down.  Keep all follow-up visits as told by your doctor. This is important. GET HELP IF:  You were given a tetanus shot and you have any of these where the needle went in:  Swelling.  Very bad pain.  Redness.  Bleeding.  Medicine does not help your pain.  You have any of these at the site of the wound:  More redness.  More swelling.  More pain. GET HELP RIGHT AWAY IF:  You have a red streak going away from your wound.  You have a fever.  You have fluid, blood, or pus coming from your wound.  There is a bad smell coming from your wound.   This information is not intended to replace advice given to you by your health care provider. Make sure you discuss any questions you have with your health care provider.   Document Released: 01/30/2008 Document Revised: 12/28/2014 Document Reviewed: 08/11/2014 Elsevier Interactive Patient Education 2016 [employer]  Wear the knee wrap as needed for pain. Apply ice when resting to reduce swelling and pain. Take the prescription anti-inflammatory as directed. Follow-up with Dedra Skeensodd Mundy, PA-C as needed.

## 2015-12-11 NOTE — ED Provider Notes (Signed)
[facility] Emergency Department Provider Note ____________________________________________  Time seen: 1527  I have reviewed the triage vital signs and the nursing notes.  HISTORY  Chief Complaint  Knee Pain  HPI Gabriel Mckee is a 25 y.o. male presents to the ED for evaluation of left knee pain after he describes injury last night. He describes he was walking outside, when he accidentally fell off the steps of the porch. He describes twisting his left knee and scraping his left knee on the stairs. He denies any other injury at this time. His concern is primarily to the anterior portion of the knee where he notes some prominence the top of the shin. He reports pain is worsened by extending the knee. He denies any history of previous knee injuries or surgeries.He presents with a 2 inch elastic wrap applied to the lower aspect of the knee.  Past Medical History  Diagnosis Date  . Heart murmur   . History of kidney stones     There are no active problems to display for this patient.   No past surgical history on file.  Current Outpatient Rx  Name  Route  Sig  Dispense  Refill  . ibuprofen (ADVIL,MOTRIN) 800 MG tablet   Oral   Take 1 tablet (800 mg total) by mouth 3 (three) times daily.   30 tablet   0   . meloxicam (MOBIC) 15 MG tablet   Oral   Take 1 tablet (15 mg total) by mouth daily.   30 tablet   0   . naproxen (EC NAPROSYN) 500 MG EC tablet   Oral   Take 1 tablet (500 mg total) by mouth 2 (two) times daily with a meal.   30 tablet   0   . ondansetron (ZOFRAN) 4 MG tablet   Oral   Take 1 tablet (4 mg total) by mouth daily as needed for nausea or vomiting.   10 tablet   0    Allergies Amoxicillin and Penicillins  No family history on file.  Social History Social History  Substance Use Topics  . Smoking status: Current Every Day Smoker -- 0.50 packs/day    Types: Cigarettes  . Smokeless tobacco: Not on file  . Alcohol Use: No    Review of Systems  Constitutional: Negative for fever. Cardiovascular: Negative for chest pain. Respiratory: Negative for shortness of breath. Musculoskeletal: Negative for back pain. Left knee pain as above. Skin: Negative for rash. Neurological: Negative for headaches, focal weakness or numbness. ____________________________________________  PHYSICAL EXAM:  VITAL SIGNS: ED Triage Vitals  Enc Vitals Group     BP 12/11/15 1431 115/70 mmHg     Pulse Rate 12/11/15 1431 88     Resp 12/11/15 1431 15     Temp 12/11/15 1431 98 F (36.7 C)     Temp Source 12/11/15 1431 Oral     SpO2 12/11/15 1431 100 %     Weight 12/11/15 1431 150 lb (68.04 kg)     Height 12/11/15 1431 5\' 11"  (1.803 m)     Head Cir --      Peak Flow --      Pain Score 12/11/15 1435 8     Pain Loc --      Pain Edu? --      Excl. in GC? --    Constitutional: Alert and oriented. Well appearing and in no distress. Head: Normocephalic and atraumatic. Mouth/Throat: Poor dentition noted. Hematological/Lymphatic/Immunological: No edema. Cardiovascular: Normal rate, regular rhythm.  Respiratory: Normal respiratory effort.  Musculoskeletal: Left knee without obvious deformity, dislocation, or effusion. Patient with a small abrasion noted to the inferior aspect of the patella. He is with prominent as noted to the tibial tuberosity. No joint effusion is appreciated. Patient is noted to have normal flexion and extension range with normal tracking of the patella. There is no appreciable medial or lateral joint line tenderness. No valgus or varus joint stress. Patient with a negative anterior posterior drawer testing. He is with some tenderness to palpation at the inferior patellar insertion over the prominence of the tibial tuberosity. No pop wasfullness is noted. No calf or Achilles tenderness. Nontender with normal range of motion in all other extremities.  Neurologic:  Normal gait without ataxia. Normal speech and language.  No gross focal neurologic deficits are appreciated. Skin:  Skin is warm, dry and intact. No rash noted. Psychiatric: Mood and affect are normal. Patient exhibits appropriate insight and judgment. ___________________________________________   RADIOLOGY  Left Knee  IMPRESSION: Negative.  I, Braiden Presutti, Charlesetta Ivory, personally viewed and evaluated these images (plain radiographs) as part of my medical decision making, as well as reviewing the written report by the radiologist. ____________________________________________  PROCEDURES  Patient's elastic bandage reapplied Ultram 50 mg PO ____________________________________________  INITIAL IMPRESSION / ASSESSMENT AND PLAN / ED COURSE  Patient with a left knee contusion and superficial abrasion without clinical evidence of internal derangement or radiologic evidence of fracture or dislocation. Prominence to the tibial tuberosity likely due to Osgood-Schlatter's disease. He should continue to apply ice to the knee and with a bandage as needed for support. He'll follow-up with his primary care provider or Dedra Skeens, PA-C as needed for re-evaluation. He is provided with a prescription for EC Naprosyn to dose as needed for pain & inflammation relief. ____________________________________________  FINAL CLINICAL IMPRESSION(S) / ED DIAGNOSES  Final diagnoses:  Knee contusion, left, initial encounter  Knee abrasion, left, initial encounter  Patellar tendinitis of left knee  Osgood-Schlatter's disease of left knee      Lissa Hoard, PA-C 12/11/15 1736

## 2015-12-28 ENCOUNTER — Encounter: Payer: Self-pay | Admitting: Emergency Medicine

## 2015-12-28 ENCOUNTER — Emergency Department
Admission: EM | Admit: 2015-12-28 | Discharge: 2015-12-28 | Disposition: A | Discharge status: Home / Self Care | Payer: Self-pay | Attending: Emergency Medicine | Admitting: Emergency Medicine

## 2015-12-28 DIAGNOSIS — F17210 Nicotine dependence, cigarettes, uncomplicated: Secondary | ICD-10-CM | POA: Insufficient documentation

## 2015-12-28 DIAGNOSIS — J040 Acute laryngitis: Secondary | ICD-10-CM | POA: Insufficient documentation

## 2015-12-28 LAB — POCT RAPID STREP A: STREPTOCOCCUS, GROUP A SCREEN (DIRECT): NEGATIVE

## 2015-12-28 MED ORDER — MAGIC MOUTHWASH W/LIDOCAINE: 5.0000 mL | Freq: Four times a day (QID) | ORAL | Status: AC | PRN

## 2015-12-28 MED ORDER — PREDNISONE 20 MG PO TABS: ORAL_TABLET | ORAL | Status: AC

## 2015-12-28 NOTE — Discharge Instructions (Signed)
Laryngitis  Laryngitis is swelling (inflammation) of your vocal cords. This causes hoarseness, coughing, loss of voice, sore throat, or a dry throat. When your vocal cords are inflamed, your voice sounds different.  Laryngitis can be temporary (acute) or long-term (chronic). Most cases of acute laryngitis improve with time. Chronic laryngitis is laryngitis that lasts for more than three weeks.  HOME CARE  · Drink enough fluid to keep your pee (urine) clear or pale yellow.  · Breathe in moist air. Use a humidifier if you live in a dry climate.  · Take medicines only as told by your doctor.  · Do not smoke cigarettes or electronic cigarettes. If you need help quitting, ask your doctor.  · Talk as little as possible. Also avoid whispering, which can cause vocal strain.  · Write instead of talking. Do this until your voice is back to normal.  GET HELP IF:  · You have a fever.  · Your pain is worse.  · You have trouble swallowing.  GET HELP RIGHT AWAY IF:  · You cough up blood.  · You have trouble breathing.     This information is not intended to replace advice given to you by your health care provider. Make sure you discuss any questions you have with your health care provider.     Document Released: 08/02/2011 Document Revised: 09/03/2014 Document Reviewed: 01/26/2014  Elsevier Interactive Patient Education ©2016 [employer]

## 2015-12-28 NOTE — ED Notes (Signed)
Patient presents to the ED with sore throat and hoarse voice.  The back of patient's throat is difficult to visualize.  Patient reports difficulty eating and swallowing.

## 2015-12-28 NOTE — ED Notes (Signed)
States he woke up with sore throat and hoarseness this am  Increased pain with swallowing

## 2015-12-28 NOTE — ED Provider Notes (Signed)
[facility] Emergency Department Provider Note  ____________________________________________  Time seen: Approximately 7:03 PM  I have reviewed the triage vital signs and the nursing notes.   HISTORY  Chief Complaint Sore Throat    HPI Gabriel Mckee is a 25 y.o. male , NAD, presents to the emergency department with onset of sore throat and hoarse voice as this morning. States it is difficult to eat and swallow due to pain. Has not had any difficulty breathing or notes any swelling. Has not had any recent cough, chest congestion, runny nose, nasal congestion, ear pain or sinus pressure. Does state he had some allergy symptoms approximately 3 weeks ago that have resolved. Denies any fever, chills, body aches. It is a daily smoker but has not been able to smoke today.   Past Medical History  Diagnosis Date  . Heart murmur   . History of kidney stones     There are no active problems to display for this patient.   History reviewed. No pertinent past surgical history.  Current Outpatient Rx  Name  Route  Sig  Dispense  Refill  . magic mouthwash w/lidocaine SOLN   Oral   Take 5 mLs by mouth 4 (four) times daily as needed for mouth pain.   240 mL   0     Please mix 80mL diphenhydramine, 80mL nystatin, 80 ...   . predniSONE (DELTASONE) 20 MG tablet      Take 2 tablets by mouth, once daily, for 5 days   10 tablet   0     Allergies Amoxicillin and Penicillins  No family history on file.  Social History Social History  Substance Use Topics  . Smoking status: Current Every Day Smoker -- 0.50 packs/day    Types: Cigarettes  . Smokeless tobacco: None  . Alcohol Use: No     Review of Systems  Constitutional: No fever/chills, fatigue Eyes: No visual changes. No discharge ENT: Positive hoarse voice, sore throat. No nasal congestion, runny nose, sneezing, sinus pressure, ear pain. Cardiovascular: No chest pain. Respiratory: No cough. No  shortness of breath. No wheezing.  Gastrointestinal: No abdominal pain.  No nausea, vomiting.  Musculoskeletal: Negative for neck pain.  Skin: Negative for rash. Neurological: Negative for headaches, focal weakness or numbness. 10-point ROS otherwise negative.  ____________________________________________   PHYSICAL EXAM:  VITAL SIGNS: ED Triage Vitals  Enc Vitals Group     BP 12/28/15 1824 123/78 mmHg     Pulse Rate 12/28/15 1824 75     Resp 12/28/15 1824 18     Temp 12/28/15 1824 97.8 F (36.6 C)     Temp Source 12/28/15 1824 Oral     SpO2 12/28/15 1824 99 %     Weight 12/28/15 1824 150 lb (68.04 kg)     Height 12/28/15 1824  (1.803 m)     Head Cir --      Peak Flow --      Pain Score 12/28/15 1828 7     Pain Loc --      Pain Edu? --      Excl. in GC? --      Constitutional: Alert and oriented. Well appearing and in no acute distress. Eyes: Conjunctivae are normal. PERRL. EOMI without pain.  Head: Atraumatic. ENT:      Ears: TMs visualized bilaterally without erythema, effusion, bulging, perforation.      Nose: No congestion but trace clear rhinnorhea.      Mouth/Throat: Hoarse voice. Mucous  membranes are moist. Pharynx with mild injection but no overt erythema. Pharynx without swelling, exudates. Clear postnasal drip with mild cobblestoning of posterior pharynx is noted. Neck: No stridor. No carotid bruits. No cervical spine tenderness to palpation. Supple with full range of motion and no signs of meningismus. Hematological/Lymphatic/Immunilogical: No cervical lymphadenopathy but tenderness to palpation about bilateral anterior cervical chains. Cardiovascular: Normal rate, regular rhythm. Normal S1 and S2.  Good peripheral circulation. Respiratory: Normal respiratory effort without tachypnea or retractions. Lungs CTAB with breath sounds noted in all lung fields. Neurologic:  Normal speech and language. No gross focal neurologic deficits are appreciated.  Skin:   Skin is warm, dry and intact. No rash noted. Psychiatric: Mood and affect are normal. Speech and behavior are normal. Patient exhibits appropriate insight and judgement.   ____________________________________________   LABS (all labs ordered are listed, but only abnormal results are displayed)  Labs Reviewed  CULTURE, GROUP A STREP [facility]  POCT RAPID STREP A   ____________________________________________  EKG  None ____________________________________________  RADIOLOGY  None ____________________________________________    PROCEDURES  Procedure(s) performed: None    Medications - No data to display   ____________________________________________   INITIAL IMPRESSION / ASSESSMENT AND PLAN / ED COURSE  Pertinent lab results that were available during my care of the patient were reviewed by me and considered in my medical decision making (see chart for details).  Patient's diagnosis is consistent with laryngitis. Patient will be discharged home with prescriptions for prednisone and Magic mouthwash to take as directed. Patient may take Tylenol as needed for pain. Advised warm to [place] to soothe the throat. Patient is to follow up with Dr. Willeen CassBennett in ENT if no resolution within 3-5 days or any worsening.  Patient is given ED precautions to return to the ED for any worsening or new symptoms.    ____________________________________________  FINAL CLINICAL IMPRESSION(S) / ED DIAGNOSES  Final diagnoses:  Laryngitis      NEW MEDICATIONS STARTED DURING THIS VISIT:  New Prescriptions   MAGIC MOUTHWASH W/LIDOCAINE SOLN    Take 5 mLs by mouth 4 (four) times daily as needed for mouth pain.   PREDNISONE (DELTASONE) 20 MG TABLET    Take 2 tablets by mouth, once daily, for 5 days         Hope PigeonJami L Amadi Frady, PA-C 12/28/15 1918  Phineas SemenGraydon Goodman, MD 12/28/15 2010

## 2015-12-30 LAB — CULTURE, GROUP A STREP (THRC)

## 2016-02-02 ENCOUNTER — Emergency Department: Payer: Self-pay

## 2016-02-02 ENCOUNTER — Emergency Department
Admission: EM | Admit: 2016-02-02 | Discharge: 2016-02-02 | Disposition: A | Discharge status: Home / Self Care | Payer: Self-pay | Attending: Emergency Medicine | Admitting: Emergency Medicine

## 2016-02-02 DIAGNOSIS — F17210 Nicotine dependence, cigarettes, uncomplicated: Secondary | ICD-10-CM | POA: Insufficient documentation

## 2016-02-02 DIAGNOSIS — Y999 Unspecified external cause status: Secondary | ICD-10-CM | POA: Insufficient documentation

## 2016-02-02 DIAGNOSIS — W228XXA Striking against or struck by other objects, initial encounter: Secondary | ICD-10-CM | POA: Insufficient documentation

## 2016-02-02 DIAGNOSIS — S0990XA Unspecified injury of head, initial encounter: Secondary | ICD-10-CM | POA: Insufficient documentation

## 2016-02-02 DIAGNOSIS — Y929 Unspecified place or not applicable: Secondary | ICD-10-CM | POA: Insufficient documentation

## 2016-02-02 DIAGNOSIS — Y939 Activity, unspecified: Secondary | ICD-10-CM | POA: Insufficient documentation

## 2016-02-02 NOTE — ED Notes (Signed)
Pt in with co left forehead pain, states hit corner of car door and states did have syncopal episode.

## 2017-05-01 IMAGING — CR DG HAND COMPLETE 3+V*R*
1 series · 3 of 3 positions shown · non-contrast
Comparison: Right hand radiographs performed 07/01/2015

CLINICAL DATA: Transmission fell on right hand, with right fourth
and fifth metacarpal pain. Initial encounter.

EXAM:
RIGHT HAND - COMPLETE 3+ VIEW

[Series 1: dg hand complete right · 0.14mm/px · 3 of 3 slices shown]
[im 1/3]
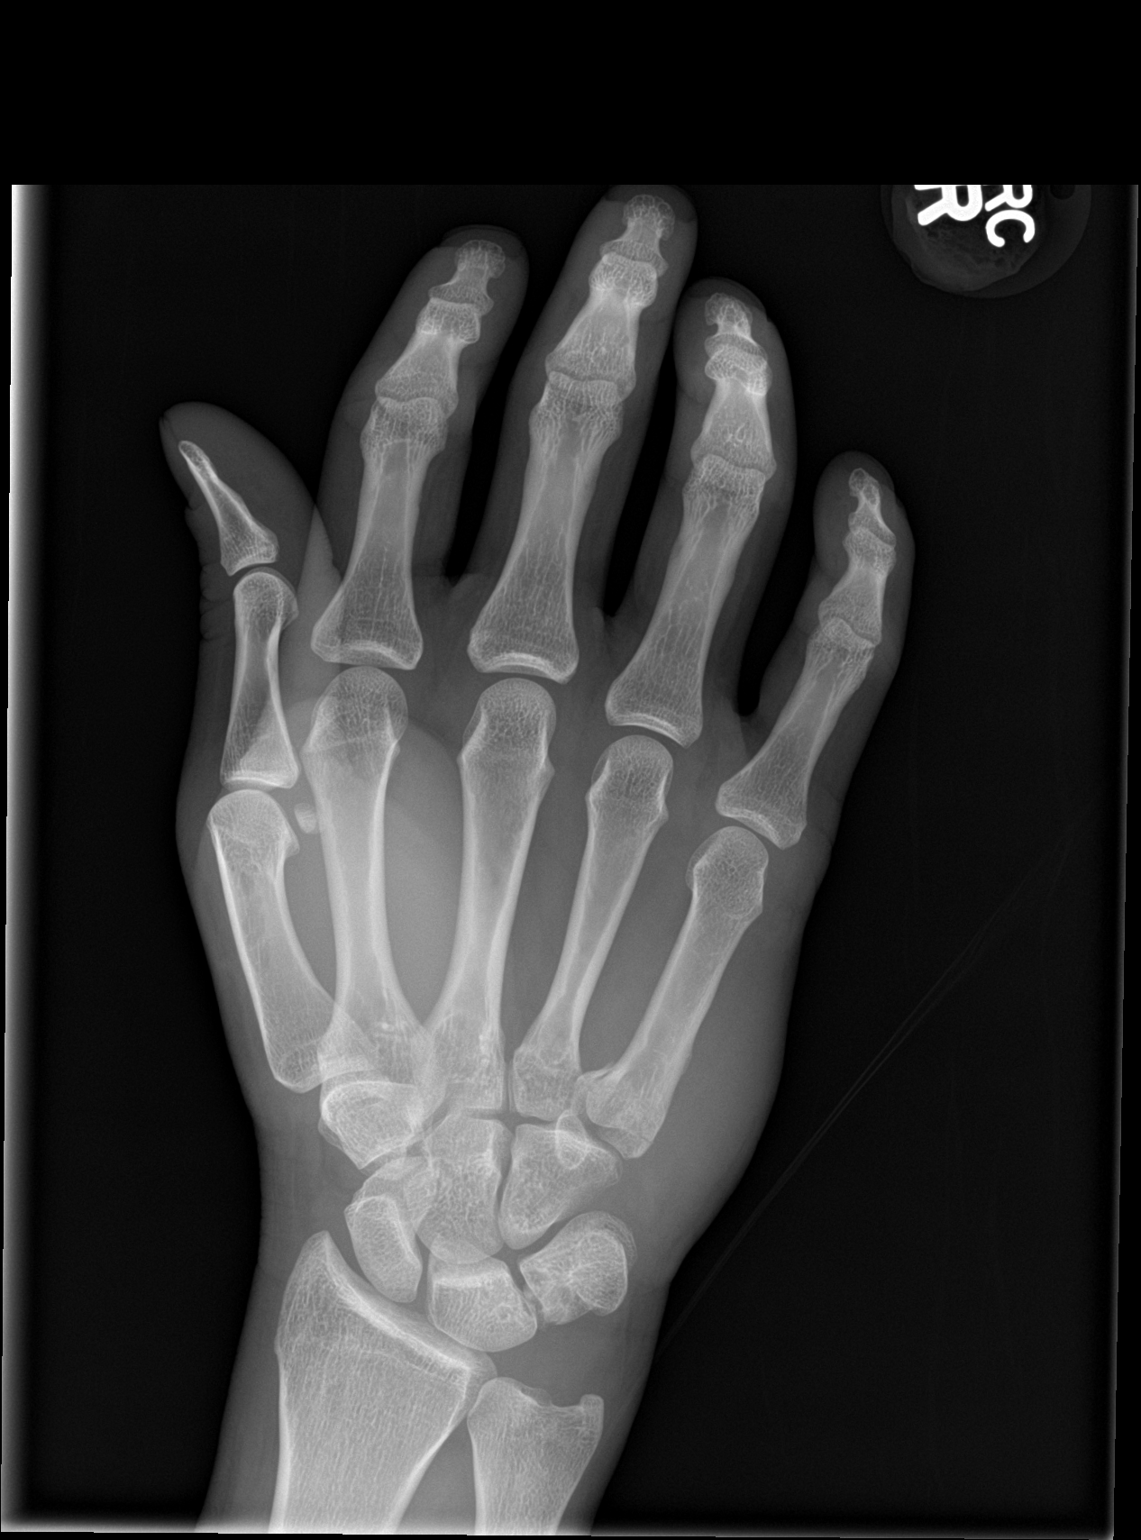
[im 2/3]
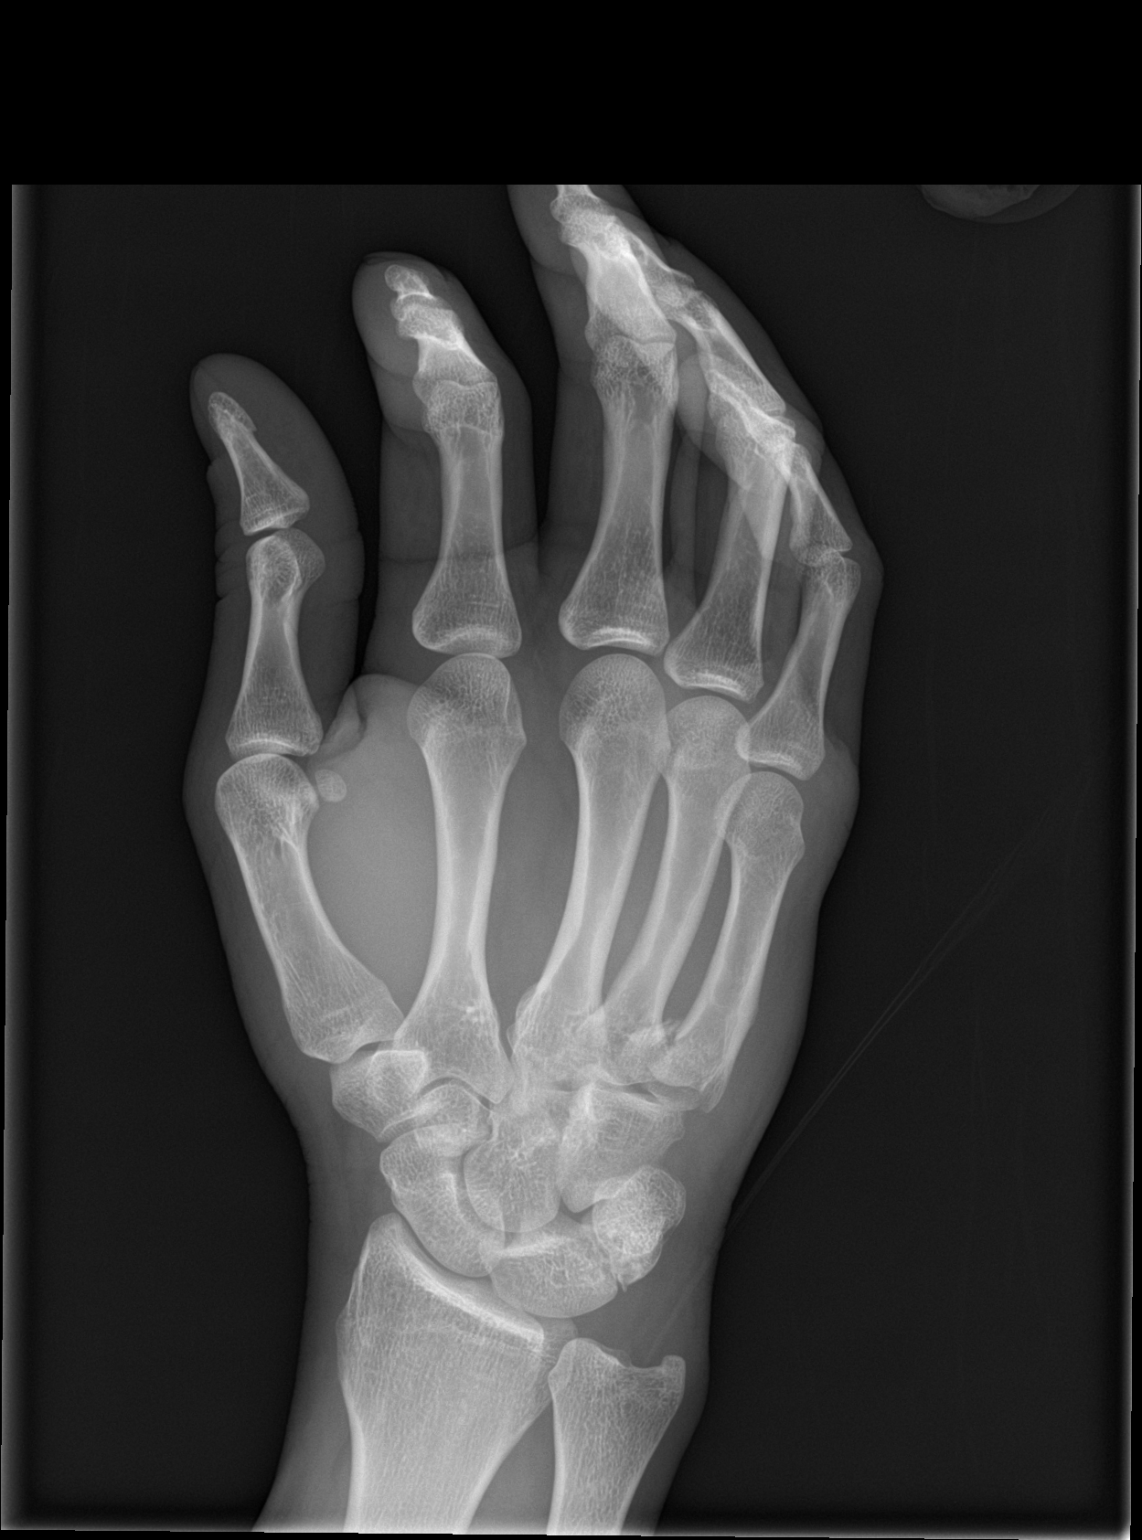
[im 3/3]
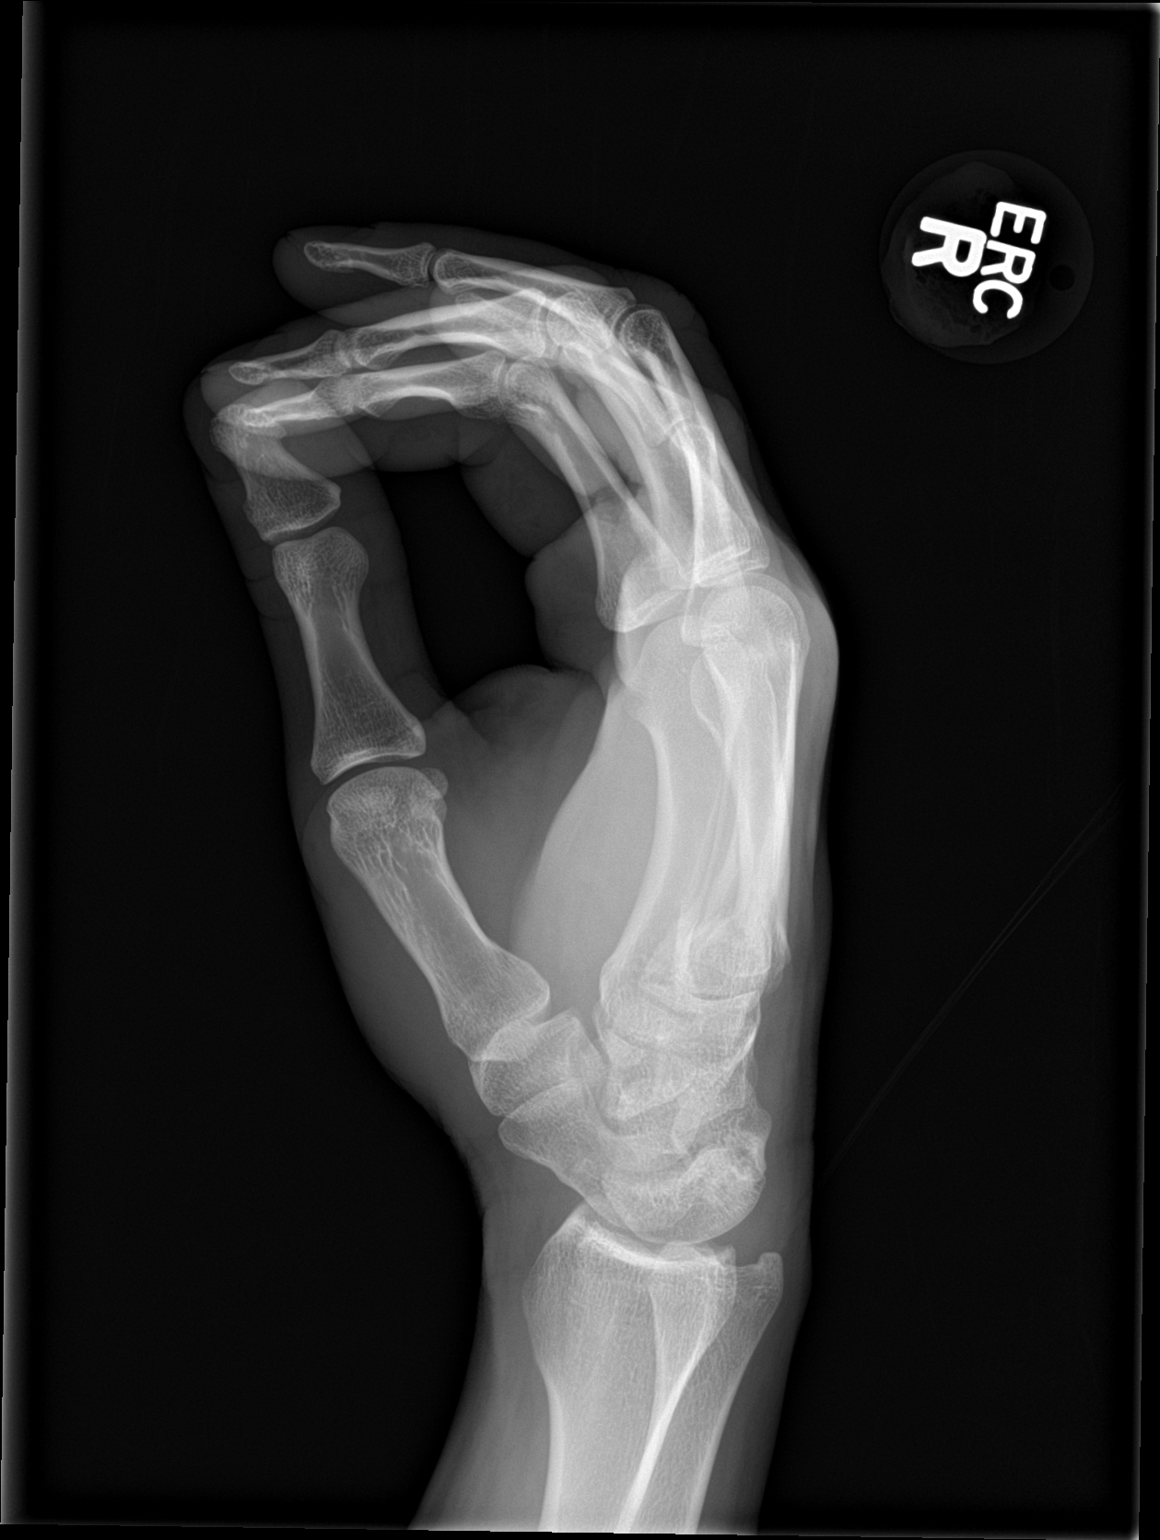

[3 of 3 positions shown; findings below may reference images not displayed]

FINDINGS: There is a mildly displaced slightly comminuted fracture through the
base of the right fifth metacarpal, extending to the carpometacarpal
joint. Mild dorsal subluxation of the base of the fifth metacarpal
is noted on the lateral view.

No additional fractures are seen. Visualized joint spaces are
otherwise grossly unremarkable. The carpal rows appear grossly
intact. Soft tissue swelling is noted overlying the fracture site.
IMPRESSION: Mildly displaced slightly comminuted fracture through the base of
the right fifth metacarpal, extending to the carpometacarpal joint,
with mild dorsal subluxation of the base of the fifth metacarpal.
